# Patient Record
Sex: Male | Born: 1980 | Hispanic: Yes | Marital: Married | Smoking: Never smoker
Health system: Southern US, Community
[De-identification: ages and names within clinical notes are randomized; demographics above are authoritative.]

## PROBLEM LIST (undated history)

## (undated) DIAGNOSIS — Z8614 Personal history of Methicillin resistant Staphylococcus aureus infection: Secondary | ICD-10-CM

## (undated) DIAGNOSIS — K219 Gastro-esophageal reflux disease without esophagitis: Secondary | ICD-10-CM

## (undated) DIAGNOSIS — G4730 Sleep apnea, unspecified: Secondary | ICD-10-CM

## (undated) HISTORY — PX: COLONOSCOPY: SHX174

---

## 2011-08-20 DIAGNOSIS — Z8614 Personal history of Methicillin resistant Staphylococcus aureus infection: Secondary | ICD-10-CM

## 2011-08-20 HISTORY — DX: Personal history of Methicillin resistant Staphylococcus aureus infection: Z86.14

## 2016-05-24 NOTE — Patient Instructions (Signed)
  Your procedure is scheduled on: 05-31-16 (FRIDAY) Report to Same Day Surgery 2nd floor medical mall To find out your arrival time please call [phone] between 1PM - 3PM on 05-30-16 (THURSDAY)  Remember: Instructions that are not followed completely may result in serious medical risk, up to and including death, or upon the discretion of your surgeon and anesthesiologist your surgery may need to be rescheduled.    _x___ 1. Do not eat food or drink liquids after midnight. No gum chewing or hard candies.     __x__ 2. No Alcohol for 24 hours before or after surgery.   __x__3. No Smoking for 24 prior to surgery.   ____  4. Bring all medications with you on the day of surgery if instructed.    __x__ 5. Notify your doctor if there is any change in your medical condition     (cold, fever, infections).     Do not wear jewelry, make-up, hairpins, clips or nail polish.  Do not wear lotions, powders, or perfumes. You may wear deodorant.  Do not shave 48 hours prior to surgery. Men may shave face and neck.  Do not bring valuables to the hospital.    [facility] for any belongings or valuables.               Contacts, dentures or bridgework may not be worn into surgery.  Leave your suitcase in the car. After surgery it may be brought to your room.  For patients admitted to the hospital, discharge time is determined by your treatment team.   Patients discharged the day of surgery will not be allowed to drive home.    Please read over the following fact sheets that you were given:   [facility] Preparing for Surgery and or MRSA Information   ____ Take these medicines the morning of surgery with A SIP OF WATER:    1. NONE  2.  3.  4.  5.  6.  ____Fleets enema or Magnesium Citrate as directed.   ____ Use CHG Soap or sage wipes as directed on instruction sheet   ____ Use inhalers on the day of surgery and bring to hospital day of surgery  ____ Stop metformin 2  days prior to surgery    ____ Take 1/2 of usual insulin dose the night before surgery and none on the morning of surgery.   ____ Stop aspirin or coumadin, or plavix  x__ Stop Anti-inflammatories such as Advil, Aleve, Ibuprofen, Motrin, Naproxen,          Naprosyn, Goodies powders or aspirin products NOW- Ok to take Tylenol.   ____ Stop supplements until after surgery.    ____ Bring C-Pap to the hospital.

## 2016-05-27 ENCOUNTER — Encounter
Admission: RE | Admit: 2016-05-27 | Discharge: 2016-05-27 | Disposition: A | Discharge status: Home / Self Care | Payer: 59 | Source: Ambulatory Visit | Attending: Surgery | Admitting: Surgery

## 2016-05-27 DIAGNOSIS — Z01818 Encounter for other preprocedural examination: Secondary | ICD-10-CM | POA: Diagnosis present

## 2016-05-27 HISTORY — DX: Sleep apnea, unspecified: G47.30

## 2016-05-27 HISTORY — DX: Personal history of Methicillin resistant Staphylococcus aureus infection: Z86.14

## 2016-05-27 HISTORY — DX: Gastro-esophageal reflux disease without esophagitis: K21.9

## 2016-05-27 LAB — SURGICAL PCR SCREEN
MRSA, PCR: NEGATIVE
Staphylococcus aureus: POSITIVE — AB

## 2016-05-31 ENCOUNTER — Encounter
Admission: RE | Disposition: A | Discharge status: Home / Self Care | Payer: Self-pay | Source: Ambulatory Visit | Attending: Surgery

## 2016-05-31 ENCOUNTER — Ambulatory Visit: Payer: 59 | Admitting: Certified Registered Nurse Anesthetist

## 2016-05-31 ENCOUNTER — Ambulatory Visit
Admission: RE | Admit: 2016-05-31 | Discharge: 2016-05-31 | Disposition: A | Discharge status: Home / Self Care | Payer: 59 | Source: Ambulatory Visit | Attending: Surgery | Admitting: Surgery

## 2016-05-31 DIAGNOSIS — G4730 Sleep apnea, unspecified: Secondary | ICD-10-CM | POA: Insufficient documentation

## 2016-05-31 DIAGNOSIS — K603 Anal fistula: Secondary | ICD-10-CM | POA: Insufficient documentation

## 2016-05-31 DIAGNOSIS — K6289 Other specified diseases of anus and rectum: Secondary | ICD-10-CM | POA: Diagnosis present

## 2016-05-31 DIAGNOSIS — K219 Gastro-esophageal reflux disease without esophagitis: Secondary | ICD-10-CM | POA: Insufficient documentation

## 2016-05-31 HISTORY — PX: FISTULOTOMY: SHX6413

## 2016-05-31 SURGERY — FISTULOTOMY
Anesthesia: General | Wound class: Contaminated

## 2016-05-31 MED ORDER — HYDROCODONE-ACETAMINOPHEN 5-325 MG PO TABS
ORAL_TABLET | ORAL | Status: AC
  Filled 2016-05-31: qty 1

## 2016-05-31 MED ORDER — BUPIVACAINE-EPINEPHRINE (PF) 0.5% -1:200000 IJ SOLN
INTRAMUSCULAR | Status: AC
  Filled 2016-05-31: qty 30

## 2016-05-31 MED ORDER — PROPOFOL 10 MG/ML IV BOLUS
INTRAVENOUS | Status: DC | PRN
  Administered 2016-05-31: 100 mg via INTRAVENOUS
  Administered 2016-05-31: 200 mg via INTRAVENOUS

## 2016-05-31 MED ORDER — FENTANYL CITRATE (PF) 100 MCG/2ML IJ SOLN
INTRAMUSCULAR | Status: AC
  Administered 2016-05-31: 25 ug via INTRAVENOUS
  Filled 2016-05-31: qty 2

## 2016-05-31 MED ORDER — ONDANSETRON HCL 4 MG/2ML IJ SOLN: 4.0000 mg | Freq: Once | INTRAMUSCULAR | Status: DC | PRN

## 2016-05-31 MED ORDER — LIDOCAINE HCL (CARDIAC) 20 MG/ML IV SOLN
INTRAVENOUS | Status: DC | PRN
  Administered 2016-05-31: 40 mg via INTRATRACHEAL

## 2016-05-31 MED ORDER — FAMOTIDINE 20 MG PO TABS
20.0000 mg | ORAL_TABLET | Freq: Once | ORAL | Status: AC
  Administered 2016-05-31: 20 mg via ORAL

## 2016-05-31 MED ORDER — BUPIVACAINE-EPINEPHRINE (PF) 0.5% -1:200000 IJ SOLN
INTRAMUSCULAR | Status: DC | PRN
  Administered 2016-05-31: 5 mL via PERINEURAL

## 2016-05-31 MED ORDER — HYDROCODONE-ACETAMINOPHEN 5-325 MG PO TABS: 1.0000 | ORAL_TABLET | ORAL | 0 refills | Status: DC | PRN

## 2016-05-31 MED ORDER — MIDAZOLAM HCL 2 MG/2ML IJ SOLN
INTRAMUSCULAR | Status: DC | PRN
  Administered 2016-05-31: 2 mg via INTRAVENOUS

## 2016-05-31 MED ORDER — LACTATED RINGERS IV SOLN
INTRAVENOUS | Status: DC
  Administered 2016-05-31 (×2): via INTRAVENOUS

## 2016-05-31 MED ORDER — HYDROCODONE-ACETAMINOPHEN 5-325 MG PO TABS
1.0000 | ORAL_TABLET | ORAL | Status: DC | PRN
  Administered 2016-05-31 (×2): 1 via ORAL

## 2016-05-31 MED ORDER — ONDANSETRON HCL 4 MG/2ML IJ SOLN
INTRAMUSCULAR | Status: DC | PRN
  Administered 2016-05-31: 4 mg via INTRAVENOUS

## 2016-05-31 MED ORDER — FENTANYL CITRATE (PF) 100 MCG/2ML IJ SOLN
25.0000 ug | INTRAMUSCULAR | Status: DC | PRN
  Administered 2016-05-31 (×4): 25 ug via INTRAVENOUS

## 2016-05-31 MED ORDER — FAMOTIDINE 20 MG PO TABS
ORAL_TABLET | ORAL | Status: AC
  Administered 2016-05-31: 20 mg via ORAL
  Filled 2016-05-31: qty 1

## 2016-05-31 MED ORDER — FENTANYL CITRATE (PF) 100 MCG/2ML IJ SOLN
INTRAMUSCULAR | Status: DC | PRN
  Administered 2016-05-31 (×2): 50 ug via INTRAVENOUS
  Administered 2016-05-31: 100 ug via INTRAVENOUS

## 2016-05-31 SURGICAL SUPPLY — 25 items
BLADE SURG 15 STRL LF DISP TIS (BLADE) ×1 IMPLANT
BLADE SURG 15 STRL SS (BLADE) ×1
CANISTER SUCT 1200ML W/VALVE (MISCELLANEOUS) ×2 IMPLANT
DRAPE LAPAROTOMY 100X77 ABD (DRAPES) ×2 IMPLANT
DRAPE LEGGINS SURG 28X43 STRL (DRAPES) ×2 IMPLANT
ELECT REM PT RETURN 9FT ADLT (ELECTROSURGICAL) ×2
ELECTRODE REM PT RTRN 9FT ADLT (ELECTROSURGICAL) ×1 IMPLANT
GAUZE SPONGE 4X4 12PLY STRL (GAUZE/BANDAGES/DRESSINGS) ×2 IMPLANT
GLOVE BIO SURGEON STRL SZ7.5 (GLOVE) ×2 IMPLANT
GOWN STRL REUS W/ TWL LRG LVL3 (GOWN DISPOSABLE) ×2 IMPLANT
GOWN STRL REUS W/TWL LRG LVL3 (GOWN DISPOSABLE) ×2
KIT RM TURNOVER CYSTO AR (KITS) ×2 IMPLANT
LABEL OR SOLS (LABEL) IMPLANT
NEEDLE HYPO 25X1 1.5 SAFETY (NEEDLE) ×2 IMPLANT
NS IRRIG 500ML POUR BTL (IV SOLUTION) ×2 IMPLANT
PACK BASIN MINOR ARMC (MISCELLANEOUS) ×2 IMPLANT
PAD PREP 24X41 OB/GYN DISP (PERSONAL CARE ITEMS) ×2 IMPLANT
SOL PREP PVP 2OZ (MISCELLANEOUS) ×2
SOLUTION PREP PVP 2OZ (MISCELLANEOUS) ×1 IMPLANT
SURGILUBE 2OZ TUBE FLIPTOP (MISCELLANEOUS) ×2 IMPLANT
SUT CHROMIC 3 0 SH 27 (SUTURE) IMPLANT
SUT CHROMIC 5 0 RB 1 27 (SUTURE) IMPLANT
SUT VIC AB 3-0 SH 27 (SUTURE)
SUT VIC AB 3-0 SH 27X BRD (SUTURE) IMPLANT
SYRINGE 10CC LL (SYRINGE) ×2 IMPLANT

## 2016-05-31 NOTE — H&P (Signed)
He reports no change in condition since office exam.  He is awake alert oriented.  Discussed plan for anal fistulotomy.

## 2016-05-31 NOTE — Transfer of Care (Signed)
Immediate Anesthesia Transfer of Care Note  Patient: Anthony Gardner  Procedure(s) Performed: Procedure(s): FISTULOTOMY (N/A)  Patient Location: PACU  Anesthesia Type:General  Level of Consciousness: sedated  Airway & Oxygen Therapy: Patient connected to face mask oxygen  Post-op Assessment: Report given to RN  Post vital signs: stable  Last Vitals:  Vitals:   05/31/16 0558 05/31/16 0818  BP: (!) 140/107 (!) 144/102  Pulse: 96 100  Resp: 16 (!) 0  Temp: 36.6 C 36.7 C    Last Pain:  Vitals:   05/31/16 0558  TempSrc: Tympanic  PainSc: 8          Complications: No apparent anesthesia complications

## 2016-05-31 NOTE — Discharge Instructions (Addendum)
AMBULATORY SURGERY  DISCHARGE INSTRUCTIONS   1) The drugs that you were given will stay in your system until tomorrow so for the next 24 hours you should not:  A) Drive an automobile B) Make any legal decisions C) Drink any alcoholic beverage   2) You may resume regular meals tomorrow.  Today it is better to start with liquids and gradually work up to solid foods.  You may eat anything you prefer, but it is better to start with liquids, then soup and crackers, and gradually work up to solid foods.   3) Please notify your doctor immediately if you have any unusual bleeding, trouble breathing, redness and pain at the surgery site, drainage, fever, or pain not relieved by medication.    4) Additional Instructions:        Please contact your physician with any problems or Same Day Surgery at [phone], Monday through Friday 6 am to 4 pm, or [place] [facility] Main number at [phone].     Take Tylenol or Norco if needed for pain.  Should not drive or do anything dangerous when taking Norco.  May also take ibuprofen 800 mg up to 3 times per day if needed for pain.  Remove dressing either later today or tomorrow. May then shower. Sit in warm water for about 10 minutes 2 times per day until drainage has resolved.  You may pass some blood in the first 1 or 2 weeks until there is time for healing.  Tuck gauze or pad in underwear and change as needed for drainage.

## 2016-05-31 NOTE — Op Note (Signed)
OPERATIVE REPORT  PREOPERATIVE  DIAGNOSIS: . Anal fistula   POSTOPERATIVE DIAGNOSIS: . Anal fistula  PROCEDURE: . Anal fistulotomy  ANESTHESIA:  General  SURGEON: Renda RollsWilton Smith  MD   INDICATIONS: . He has history of perianal abscess and after initial drainage has had persistent drainage. An anal fistula was demonstrated in the office and surgery was recommended for definitive treatment.  The patient was placed on the operating table in the supine position under general anesthesia. The legs were elevated into the lithotomy position using ankle straps. The anal area was prepared with Betadine solution and draped with sterile towels and sheets. An opening was seen in the skin on the patient's left of midline and posterior approximately 1.8 cm from the anal orifice 5:00 position. The anal canal was dilated large enough to admit 3 fingers. The bivalve anal retractor was introduced. The internal opening of the fistula could be seen in the anal canal. A probe was inserted through the external opening and advanced up to the internal opening. Half percent Sensorcaine with epinephrine was infiltrated around the operative site. The fistulotomy was begun with incising the skin with a scalpel. Next the fistulotomy was completed with use of electrocautery and did divide a portion of the internal anal sphincter in the course of fistulotomy. The probe was removed. Several small bleeding points were cauterized. A curet was used to remove some  inflammatory tissue. Several additional bleeding points were cauterized. Hemostasis was surgically intact. The bivalve anal retractor was removed. The anoderm was retracted to further expose the fistulotomy site. Hemostasis was intact. Dressings were applied with 2 inch paper tape  The patient tolerated the procedure satisfactorily and was then prepared for transfer to the recovery room  Renda RollsWilton Smith M.D.

## 2016-05-31 NOTE — Anesthesia Preprocedure Evaluation (Signed)
Anesthesia Evaluation  Patient identified by MRN, date of birth, ID band Patient awake    Reviewed: Allergy & Precautions, H&P , NPO status , Patient's Chart, lab work & pertinent test results, reviewed documented beta blocker date and time   Airway Mallampati: II  TM Distance: >3 FB Neck ROM: full    Dental  (+) Teeth Intact   Pulmonary neg pulmonary ROS, sleep apnea ,    Pulmonary exam normal        Cardiovascular Exercise Tolerance: Good negative cardio ROS Normal cardiovascular exam Rate:Normal     Neuro/Psych negative neurological ROS  negative psych ROS   GI/Hepatic negative GI ROS, Neg liver ROS, GERD  Medicated,  Endo/Other  negative endocrine ROS  Renal/GU negative Renal ROS  negative genitourinary   Musculoskeletal   Abdominal   Peds  Hematology negative hematology ROS (+)   Anesthesia Other Findings   Reproductive/Obstetrics negative OB ROS                             Anesthesia Physical Anesthesia Plan  ASA: III  Anesthesia Plan: General LMA   Post-op Pain Management:    Induction:   Airway Management Planned:   Additional Equipment:   Intra-op Plan:   Post-operative Plan:   Informed Consent: I have reviewed the patients History and Physical, chart, labs and discussed the procedure including the risks, benefits and alternatives for the proposed anesthesia with the patient or authorized representative who has indicated his/her understanding and acceptance.     Plan Discussed with: CRNA  Anesthesia Plan Comments:         Anesthesia Quick Evaluation

## 2016-05-31 NOTE — Anesthesia Procedure Notes (Signed)
Procedure Name: LMA Insertion Date/Time: 05/31/2016 7:30 AM Performed by: Tera HelperHARRIS, Anthony Gardner Pre-anesthesia Checklist: Patient identified, Emergency Drugs available, Suction available, Patient being monitored and Timeout performed Patient Re-evaluated:Patient Re-evaluated prior to inductionOxygen Delivery Method: Circle system utilized Preoxygenation: Pre-oxygenation with 100% oxygen Intubation Type: IV induction Ventilation: Mask ventilation without difficulty LMA: LMA inserted LMA Size: 5.0 Comments: LMA changed to #5 from a #4

## 2016-06-03 NOTE — Anesthesia Postprocedure Evaluation (Signed)
Anesthesia Post Note  Patient: Anthony BilletRafael Gardner  Procedure(s) Performed: Procedure(s) (LRB): FISTULOTOMY (N/A)  Patient location during evaluation: PACU Anesthesia Type: General Level of consciousness: awake and alert Pain management: pain level controlled Vital Signs Assessment: post-procedure vital signs reviewed and stable Respiratory status: spontaneous breathing, nonlabored ventilation, respiratory function stable and patient connected to nasal cannula oxygen Cardiovascular status: blood pressure returned to baseline and stable Postop Assessment: no signs of nausea or vomiting Anesthetic complications: no    Last Vitals:  Vitals:   05/31/16 0915 05/31/16 1024  BP: (!) 138/95 (!) 134/96  Pulse: 89 83  Resp: 14 14  Temp: 36.8 C 36.7 C    Last Pain:  Vitals:   06/03/16 0820  TempSrc:   PainSc: 0-No pain                 Yevette EdwardsJames G Adams

## 2020-12-31 ENCOUNTER — Emergency Department
Admission: EM | Admit: 2020-12-31 | Discharge: 2020-12-31 | Disposition: A | Discharge status: Home / Self Care | Payer: Managed Care, Other (non HMO) | Attending: Emergency Medicine | Admitting: Emergency Medicine

## 2020-12-31 ENCOUNTER — Other Ambulatory Visit: Payer: Self-pay

## 2020-12-31 ENCOUNTER — Emergency Department: Payer: Managed Care, Other (non HMO)

## 2020-12-31 DIAGNOSIS — S161XXA Strain of muscle, fascia and tendon at neck level, initial encounter: Secondary | ICD-10-CM | POA: Diagnosis not present

## 2020-12-31 DIAGNOSIS — Y92410 Unspecified street and highway as the place of occurrence of the external cause: Secondary | ICD-10-CM | POA: Diagnosis not present

## 2020-12-31 DIAGNOSIS — S39012A Strain of muscle, fascia and tendon of lower back, initial encounter: Secondary | ICD-10-CM | POA: Diagnosis not present

## 2020-12-31 DIAGNOSIS — S199XXA Unspecified injury of neck, initial encounter: Secondary | ICD-10-CM | POA: Diagnosis present

## 2020-12-31 DIAGNOSIS — M25512 Pain in left shoulder: Secondary | ICD-10-CM | POA: Insufficient documentation

## 2020-12-31 MED ORDER — ORPHENADRINE CITRATE ER 100 MG PO TB12: 100.0000 mg | ORAL_TABLET | Freq: Two times a day (BID) | ORAL | 0 refills | Status: AC

## 2020-12-31 MED ORDER — NAPROXEN 500 MG PO TABS: 500.0000 mg | ORAL_TABLET | Freq: Two times a day (BID) | ORAL | 0 refills | Status: AC

## 2020-12-31 NOTE — Discharge Instructions (Signed)
Read and follow discharge care instruction.  Take medication as needed.
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
